# Patient Record
Sex: Male | Born: 1975 | Hispanic: No | Marital: Married | Smoking: Never smoker
Health system: Southern US, Community
[De-identification: ages and names within clinical notes are randomized; demographics above are authoritative.]

## PROBLEM LIST (undated history)

## (undated) DIAGNOSIS — Z9289 Personal history of other medical treatment: Secondary | ICD-10-CM

## (undated) DIAGNOSIS — F419 Anxiety disorder, unspecified: Secondary | ICD-10-CM

## (undated) DIAGNOSIS — M1990 Unspecified osteoarthritis, unspecified site: Secondary | ICD-10-CM

## (undated) DIAGNOSIS — I10 Essential (primary) hypertension: Secondary | ICD-10-CM

## (undated) DIAGNOSIS — M869 Osteomyelitis, unspecified: Secondary | ICD-10-CM

## (undated) DIAGNOSIS — M14671 Charcot's joint, right ankle and foot: Secondary | ICD-10-CM

## (undated) DIAGNOSIS — L97509 Non-pressure chronic ulcer of other part of unspecified foot with unspecified severity: Secondary | ICD-10-CM

## (undated) DIAGNOSIS — M109 Gout, unspecified: Secondary | ICD-10-CM

## (undated) DIAGNOSIS — S069X9A Unspecified intracranial injury with loss of consciousness of unspecified duration, initial encounter: Secondary | ICD-10-CM

## (undated) HISTORY — PX: WISDOM TOOTH EXTRACTION: SHX21

## (undated) HISTORY — PX: CHOLECYSTECTOMY: SHX55

## (undated) HISTORY — PX: FOOT FRACTURE SURGERY: SHX645

---
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
## 2016-10-01 ENCOUNTER — Emergency Department (HOSPITAL_COMMUNITY): Payer: Medicare Other

## 2016-10-01 ENCOUNTER — Encounter (HOSPITAL_COMMUNITY): Payer: Self-pay | Admitting: Emergency Medicine

## 2016-10-01 ENCOUNTER — Inpatient Hospital Stay (HOSPITAL_COMMUNITY)
Admission: EM | Admit: 2016-10-01 | Discharge: 2016-10-05 | DRG: 683 | Disposition: A | Discharge status: Home / Self Care | Payer: Medicare Other | Attending: Internal Medicine | Admitting: Internal Medicine

## 2016-10-01 DIAGNOSIS — M86671 Other chronic osteomyelitis, right ankle and foot: Secondary | ICD-10-CM | POA: Diagnosis present

## 2016-10-01 DIAGNOSIS — L97519 Non-pressure chronic ulcer of other part of right foot with unspecified severity: Secondary | ICD-10-CM | POA: Diagnosis present

## 2016-10-01 DIAGNOSIS — M14671 Charcot's joint, right ankle and foot: Secondary | ICD-10-CM | POA: Diagnosis present

## 2016-10-01 DIAGNOSIS — Z79899 Other long term (current) drug therapy: Secondary | ICD-10-CM

## 2016-10-01 DIAGNOSIS — N179 Acute kidney failure, unspecified: Secondary | ICD-10-CM | POA: Diagnosis present

## 2016-10-01 DIAGNOSIS — Z87820 Personal history of traumatic brain injury: Secondary | ICD-10-CM

## 2016-10-01 DIAGNOSIS — Z22322 Carrier or suspected carrier of Methicillin resistant Staphylococcus aureus: Secondary | ICD-10-CM

## 2016-10-01 DIAGNOSIS — E860 Dehydration: Secondary | ICD-10-CM | POA: Diagnosis present

## 2016-10-01 DIAGNOSIS — N182 Chronic kidney disease, stage 2 (mild): Secondary | ICD-10-CM | POA: Diagnosis present

## 2016-10-01 DIAGNOSIS — R55 Syncope and collapse: Secondary | ICD-10-CM | POA: Diagnosis not present

## 2016-10-01 DIAGNOSIS — M79609 Pain in unspecified limb: Secondary | ICD-10-CM | POA: Diagnosis not present

## 2016-10-01 DIAGNOSIS — G629 Polyneuropathy, unspecified: Secondary | ICD-10-CM | POA: Diagnosis present

## 2016-10-01 DIAGNOSIS — B965 Pseudomonas (aeruginosa) (mallei) (pseudomallei) as the cause of diseases classified elsewhere: Secondary | ICD-10-CM | POA: Diagnosis present

## 2016-10-01 DIAGNOSIS — I129 Hypertensive chronic kidney disease with stage 1 through stage 4 chronic kidney disease, or unspecified chronic kidney disease: Secondary | ICD-10-CM | POA: Diagnosis present

## 2016-10-01 DIAGNOSIS — M869 Osteomyelitis, unspecified: Secondary | ICD-10-CM

## 2016-10-01 DIAGNOSIS — E876 Hypokalemia: Secondary | ICD-10-CM | POA: Diagnosis present

## 2016-10-01 DIAGNOSIS — R7989 Other specified abnormal findings of blood chemistry: Secondary | ICD-10-CM

## 2016-10-01 DIAGNOSIS — I340 Nonrheumatic mitral (valve) insufficiency: Secondary | ICD-10-CM | POA: Diagnosis not present

## 2016-10-01 DIAGNOSIS — S069X9S Unspecified intracranial injury with loss of consciousness of unspecified duration, sequela: Secondary | ICD-10-CM | POA: Diagnosis not present

## 2016-10-01 DIAGNOSIS — Z91040 Latex allergy status: Secondary | ICD-10-CM | POA: Diagnosis not present

## 2016-10-01 DIAGNOSIS — M86171 Other acute osteomyelitis, right ankle and foot: Secondary | ICD-10-CM | POA: Diagnosis not present

## 2016-10-01 HISTORY — DX: Gout, unspecified: M10.9

## 2016-10-01 HISTORY — DX: Non-pressure chronic ulcer of other part of unspecified foot with unspecified severity: L97.509

## 2016-10-01 HISTORY — DX: Charcot's joint, right ankle and foot: M14.671

## 2016-10-01 HISTORY — DX: Anxiety disorder, unspecified: F41.9

## 2016-10-01 HISTORY — DX: Osteomyelitis, unspecified: M86.9

## 2016-10-01 HISTORY — DX: Unspecified osteoarthritis, unspecified site: M19.90

## 2016-10-01 HISTORY — DX: Essential (primary) hypertension: I10

## 2016-10-01 HISTORY — DX: Personal history of other medical treatment: Z92.89

## 2016-10-01 HISTORY — DX: Unspecified intracranial injury with loss of consciousness of unspecified duration, initial encounter: S06.9X9A

## 2016-10-01 LAB — CBC
HCT: 39 % (ref 39.0–52.0)
Hemoglobin: 12.5 g/dL — ABNORMAL LOW (ref 13.0–17.0)
MCH: 26.7 pg (ref 26.0–34.0)
MCHC: 32.1 g/dL (ref 30.0–36.0)
MCV: 83.2 fL (ref 78.0–100.0)
PLATELETS: 356 10*3/uL (ref 150–400)
RBC: 4.69 MIL/uL (ref 4.22–5.81)
RDW: 13.5 % (ref 11.5–15.5)
WBC: 11.5 10*3/uL — AB (ref 4.0–10.5)

## 2016-10-01 LAB — URINALYSIS, ROUTINE W REFLEX MICROSCOPIC
Bilirubin Urine: NEGATIVE
GLUCOSE, UA: NEGATIVE mg/dL
Hgb urine dipstick: NEGATIVE
KETONES UR: NEGATIVE mg/dL
LEUKOCYTES UA: NEGATIVE
NITRITE: NEGATIVE
PROTEIN: NEGATIVE mg/dL
Specific Gravity, Urine: 1.027 (ref 1.005–1.030)
pH: 5 (ref 5.0–8.0)

## 2016-10-01 LAB — BASIC METABOLIC PANEL
Anion gap: 11 (ref 5–15)
BUN: 12 mg/dL (ref 6–20)
CHLORIDE: 104 mmol/L (ref 101–111)
CO2: 24 mmol/L (ref 22–32)
CREATININE: 1.25 mg/dL — AB (ref 0.61–1.24)
Calcium: 9.7 mg/dL (ref 8.9–10.3)
Glucose, Bld: 97 mg/dL (ref 65–99)
POTASSIUM: 3.6 mmol/L (ref 3.5–5.1)
SODIUM: 139 mmol/L (ref 135–145)

## 2016-10-01 LAB — TROPONIN I: Troponin I: <0.03 ng/mL (ref <0.03)

## 2016-10-01 LAB — I-STAT CG4 LACTIC ACID, ED: LACTIC ACID, VENOUS: 1.03 mmol/L (ref 0.5–1.9)

## 2016-10-01 MED ORDER — VANCOMYCIN HCL IN DEXTROSE 1-5 GM/200ML-% IV SOLN
1000.0000 mg | Freq: Three times a day (TID) | INTRAVENOUS | Status: DC
  Administered 2016-10-01 – 2016-10-03 (×6): 1000 mg via INTRAVENOUS
  Filled 2016-10-01 (×8): qty 200

## 2016-10-01 MED ORDER — ACETAMINOPHEN 325 MG PO TABS: 650.0000 mg | ORAL_TABLET | Freq: Four times a day (QID) | ORAL | Status: DC | PRN

## 2016-10-01 MED ORDER — ONDANSETRON HCL 4 MG/2ML IJ SOLN
4.0000 mg | Freq: Four times a day (QID) | INTRAMUSCULAR | Status: DC | PRN
Start: 2016-10-01 — End: 2016-10-05

## 2016-10-01 MED ORDER — ONDANSETRON HCL 4 MG PO TABS: 4.0000 mg | ORAL_TABLET | Freq: Four times a day (QID) | ORAL | Status: DC | PRN

## 2016-10-01 MED ORDER — ACETAMINOPHEN 650 MG RE SUPP: 650.0000 mg | Freq: Four times a day (QID) | RECTAL | Status: DC | PRN

## 2016-10-01 MED ORDER — BISACODYL 10 MG RE SUPP: 10.0000 mg | Freq: Every day | RECTAL | Status: DC | PRN

## 2016-10-01 MED ORDER — SENNOSIDES-DOCUSATE SODIUM 8.6-50 MG PO TABS
1.0000 | ORAL_TABLET | Freq: Every evening | ORAL | Status: DC | PRN
Start: 2016-10-01 — End: 2016-10-03
  Filled 2016-10-01: qty 1

## 2016-10-01 MED ORDER — HEPARIN SODIUM (PORCINE) 5000 UNIT/ML IJ SOLN
5000.0000 [IU] | Freq: Three times a day (TID) | INTRAMUSCULAR | Status: DC
  Administered 2016-10-01 – 2016-10-04 (×9): 5000 [IU] via SUBCUTANEOUS
  Filled 2016-10-01 (×11): qty 1

## 2016-10-01 MED ORDER — PIPERACILLIN-TAZOBACTAM 3.375 G IVPB 30 MIN
3.3750 g | Freq: Once | INTRAVENOUS | Status: AC
  Administered 2016-10-01: 3.375 g via INTRAVENOUS
  Filled 2016-10-01: qty 50

## 2016-10-01 MED ORDER — HYDROCODONE-ACETAMINOPHEN 5-325 MG PO TABS
1.0000 | ORAL_TABLET | ORAL | Status: DC | PRN
  Administered 2016-10-01 – 2016-10-04 (×4): 2 via ORAL
  Filled 2016-10-01 (×4): qty 2

## 2016-10-01 MED ORDER — PIPERACILLIN-TAZOBACTAM 3.375 G IVPB
3.3750 g | Freq: Three times a day (TID) | INTRAVENOUS | Status: DC
  Administered 2016-10-01 – 2016-10-05 (×12): 3.375 g via INTRAVENOUS
  Filled 2016-10-01 (×13): qty 50

## 2016-10-01 MED ORDER — SODIUM CHLORIDE 0.9 % IV BOLUS (SEPSIS)
1000.0000 mL | Freq: Once | INTRAVENOUS | Status: AC
  Administered 2016-10-01: 1000 mL via INTRAVENOUS

## 2016-10-01 MED ORDER — VANCOMYCIN HCL 10 G IV SOLR
1500.0000 mg | Freq: Once | INTRAVENOUS | Status: AC
  Administered 2016-10-01: 1500 mg via INTRAVENOUS
  Filled 2016-10-01: qty 1500

## 2016-10-01 MED ORDER — SODIUM CHLORIDE 0.9 % IV SOLN
INTRAVENOUS | Status: DC
  Administered 2016-10-01 – 2016-10-02 (×4): via INTRAVENOUS
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
## 2016-10-01 NOTE — Consult Note (Signed)
WOC Nurse has reviewed record and this patient has a positive xray or MRI for osteomyelitis, this is considered outside of the scope of practice for the WOC nurse, for that reason WOC Nurse will not consult.  ? ?Re-consult if only topical wound care needed after orthopedic or surgical evaluation ?Peyson Postema MSN, RN, CWOCN, CNS, CWON-AP ?[phone]   ?

## 2016-10-01 NOTE — Progress Notes (Addendum)
Pt arrived nto unit. A&Ox4. No c/o of pain. Pt instructed to call for help when needed and verbalizes understanding.       Skin assessment done by Kandace Blitz, RN and Sabino Donovan, RN.

## 2016-10-01 NOTE — ED Notes (Signed)
Patient transported to X-ray 

## 2016-10-01 NOTE — ED Triage Notes (Signed)
Pt to ED via [facility] following witnessed syncopal episode. Pt's wife helped patient to floor, but patient does have hx TBI as a child and per wife, was misdiagnosed and given psychotropic drugs for years. Pt's wife states patient is supposed to have another scan of his head, but it was never scheduled. Patient neuro status is at baseline, A&O x 4. Pt denies pain.

## 2016-10-01 NOTE — ED Notes (Signed)
Ordered regular tray 

## 2016-10-01 NOTE — Progress Notes (Signed)
Pharmacy Antibiotic Note  Lance Wiley is a 41 y.o. male admitted on 10/01/2016 with osteomyelitis.  Pharmacy has been consulted for vancomycin and zosyn dosing. Pt is afebrile and WBC is mildly elevated at 11.5. SCr is mildly elevated at 1.25.   Plan: Vancomycin 1500mg  IV x 1 then 1gm IV Q8H Zosyn 3.375gm IV Q8H (4 hr inf0 F/u renal fxn, C&S, clinical status and trough at SS  Weight: 197 lb (89.4 kg)  Temp (24hrs), Avg:99.2 F (37.3 C), Min:99.2 F (37.3 C), Max:99.2 F (37.3 C)   Recent Labs Lab 10/01/16 0305  WBC 11.5*  CREATININE 1.25*    CrCl cannot be calculated (Unknown ideal weight.).    Allergies  Allergen Reactions  . Latex Rash    Antimicrobials this admission: Vanc 8/24>> Zosyn 8/24>>  Dose adjustments this admission: N/A  Microbiology results: Pending  Thank you for allowing pharmacy to be a part of this patient's care.  [name], Drake Leach 10/01/2016 1:20 PM
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
## 2016-10-01 NOTE — Progress Notes (Signed)
*  PRELIMINARY RESULTS* Vascular Ultrasound Right lower extremity venous duplex has been completed.  Preliminary findings: No evidence of deep vein thrombosis or Baker's cyst on the right.  Large lymph node noted in the right popliteal fossa, 3.2 x 2.2x 1.8cm.  Attempted to call preliminary report to ED nurse @ 11:55, no answer.  Chauncey Fischer 10/01/2016, 12:04 PM

## 2016-10-01 NOTE — Consult Note (Signed)
Reason for Consult:Foot wound Referring Physician: T Lance Wiley is an 41 y.o. male.  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 He has Charcot foot and has hx/o osteomyelitis. He has undergone multiple surgeries on the foot and is under the care of a podiatrist in [place]. He and his wife recently moved here but are in the process of moving back in the next couple of weeks. He was in his usual state of health when he entered an IP psych hospital for mediation adjustment. During that week his chronic plantar ulcer worsened with increased pain and drainage. The wife had been treating that but he had a syncopal episode and came to the ED.  Past Medical History:  Diagnosis Date  . Charcot's joint, right ankle and foot   . Hypertension   . TBI (traumatic brain injury) ([place])     No past surgical history on file.  No family history on file.  Social History:  reports that he has never smoked. He has never used smokeless tobacco. He reports that he does not drink alcohol or use drugs.  Allergies:  Allergies  Allergen Reactions  . Latex Rash    Medications: I have reviewed the patient's current medications.  Results for orders placed or performed during the hospital encounter of 10/01/16 (from the past 48 hour(s))  Urinalysis, Routine w reflex microscopic     Status: None   Collection Time: 10/01/16  2:53 AM  Result Value Ref Range   Color, Urine YELLOW YELLOW   APPearance CLEAR CLEAR   Specific Gravity, Urine 1.027 1.005 - 1.030   pH 5.0 5.0 - 8.0   Glucose, UA NEGATIVE NEGATIVE mg/dL   Hgb urine dipstick NEGATIVE NEGATIVE   Bilirubin Urine NEGATIVE NEGATIVE   Ketones, ur NEGATIVE NEGATIVE mg/dL   Protein, ur NEGATIVE NEGATIVE mg/dL   Nitrite NEGATIVE NEGATIVE   Leukocytes, UA NEGATIVE NEGATIVE  Basic metabolic panel     Status: Abnormal   Collection Time: 10/01/16  3:05 AM  Result Value Ref Range   Sodium 139 135 - 145 mmol/L   Potassium 3.6 3.5 - 5.1 mmol/L   Chloride 104 101 - 111 mmol/L   CO2 24 22 - 32 mmol/L   Glucose, Bld 97 65 - 99 mg/dL   BUN 12 6 - 20 mg/dL   Creatinine, Ser 1.25 (H) 0.61 - 1.24 mg/dL   Calcium 9.7 8.9 - 10.3 mg/dL   GFR calc non Af Amer >60 >60 mL/min   GFR calc Af Amer >60 >60 mL/min    Comment: (NOTE) The eGFR has been calculated using the CKD EPI equation. This calculation has not been validated in all clinical situations. eGFR's persistently <60 mL/min signify possible Chronic Kidney Disease.    Anion gap 11 5 - 15  CBC     Status: Abnormal   Collection Time: 10/01/16  3:05 AM  Result Value Ref Range   WBC 11.5 (H) 4.0 - 10.5 K/uL   RBC 4.69 4.22 - 5.81 MIL/uL   Hemoglobin 12.5 (L) 13.0 - 17.0 g/dL   HCT 39.0 39.0 - 52.0 %   MCV 83.2 78.0 - 100.0 fL   MCH 26.7 26.0 - 34.0 pg   MCHC 32.1 30.0 - 36.0 g/dL   RDW 13.5 11.5 - 15.5 %   Platelets 356 150 - 400 K/uL    Dg Ankle Complete Right  Result  Date: 10/01/2016 CLINICAL DATA:  Right foot and ankle swelling. EXAM: RIGHT ANKLE - COMPLETE 3+ VIEW COMPARISON:  None. FINDINGS: There is severe arthritis of the ankle joint with prominent osteophyte formation, loose bodies in the joint, and chronic extensive calcification in the distal Achilles tendon. There is also calcification in the plantar fascia. There is arthritis in the midfoot and in the subtalar joint. Prominent joint effusion at the ankle. IMPRESSION: Severe arthritis of the ankle joint with a prominent joint effusion. Loose bodies in the joint. Electronically Signed   By: Lorriane Shire M.D.   On: 10/01/2016 11:34   Ct Head Wo Contrast  Result Date: 10/01/2016 CLINICAL DATA:  Passed out at 6:30 p.m. last night. Tingling in the right leg and shoulder. History of traumatic brain injury in 1983. EXAM: CT HEAD WITHOUT CONTRAST TECHNIQUE: Contiguous axial images were obtained from the base of the skull through the vertex without intravenous  contrast. COMPARISON:  None. FINDINGS: Brain: No evidence of acute infarction, hemorrhage, hydrocephalus, extra-axial collection or mass lesion/mass effect. Encephalomalacia of the right anterior temporal lobe and right anterior and inferior frontal lobe. There is asymmetric gliosis in the right more than left frontal white matter with ventriculomegaly. Vascular: No hyperdense vessel or unexpected calcification. Skull: Multiple large calcified scalp masses consistent with dermal inclusion cyst. Old burr hole, likely for shunt, and left parietal bone. There is asymmetric calvarial thickening sclerosis on the right which may be sequela of previous asymmetry. Sinuses/Orbits: No acute finding. IMPRESSION: 1. No acute finding. 2. Right frontotemporal encephalomalacia and bitemporal atrophy correlating with history of remote traumatic brain injury. 3. Numerous dermal inclusion cysts in the scalp. Electronically Signed   By: Monte Fantasia M.D.   On: 10/01/2016 03:13   Dg Foot Complete Right  Result Date: 10/01/2016 CLINICAL DATA:  Right foot and ankle swelling for 7 days. EXAM: RIGHT FOOT COMPLETE - 3+ VIEW COMPARISON:  None. FINDINGS: There is marked destruction and deformity of the forefoot including the MTP joints and IP joints diffusely. Bones are well corticated suggesting a chronic process. Advanced degenerative changes in the hindfoot. Large calcification noted in the region of the distal Achilles, likely related to old Achilles/soft tissue injury. No definite acute fracture. Soft tissues are intact. IMPRESSION: Marked destruction deformity of the MTP and IP joints throughout the right foot most compatible with chronic osteomyelitis. Note definite acute process. If there is clinical concern for acute osteomyelitis, MRI may be beneficial for further evaluation. Electronically Signed   By: Rolm Baptise M.D.   On: 10/01/2016 11:33    Review of Systems  Constitutional: Negative for weight loss.  HENT:  Negative for ear discharge, ear pain, hearing loss and tinnitus.   Eyes: Negative for blurred vision, double vision, photophobia and pain.  Respiratory: Negative for cough, sputum production and shortness of breath.   Cardiovascular: Negative for chest pain.  Gastrointestinal: Negative for abdominal pain, nausea and vomiting.  Genitourinary: Negative for dysuria, flank pain, frequency and urgency.  Musculoskeletal: Positive for joint pain (Right foot). Negative for back pain, falls, myalgias and neck pain.  Neurological: Positive for sensory change and loss of consciousness. Negative for dizziness, tingling, focal weakness and headaches.  Endo/Heme/Allergies: Does not bruise/bleed easily.  Psychiatric/Behavioral: Negative for depression, memory loss and substance abuse. The patient is not nervous/anxious.    Blood pressure 133/86, pulse 89, temperature 99.2 F (37.3 C), temperature source Oral, resp. rate 14, weight 89.4 kg (197 lb), SpO2 98 %. Physical Exam  Constitutional: He  appears well-developed and well-nourished. No distress.  HENT:  Head: Normocephalic.  Eyes: Conjunctivae are normal. Right eye exhibits no discharge. Left eye exhibits no discharge. No scleral icterus.  Cardiovascular: Normal rate and regular rhythm.   Respiratory: Effort normal. No respiratory distress.  Musculoskeletal:  RLE No traumatic wounds, ecchymosis, or rash  TTP medial foot/ankle  No knee effusion, likely some ankle effusion or generalized swelling based on wife's description  Knee stable to varus/ valgus and anterior/posterior stress  Sens DPN, SPN, TN nearly absent  Motor EHL, ext, flex, evers 5/5, limited motion  DP 2+, PT 2+   Neurological: He is alert.  Skin: Skin is warm and dry. He is not diaphoretic.  Psychiatric: He has a normal mood and affect. His behavior is normal.      Assessment/Plan: Syncope -- Admit by IM for workup. Unsure if related to systemic involvement from acute on chronic  infection of foot or psych medication adjustments or other. Right Charcot foot -- WOC consult for wound, MR for delineation of acute vs chronic involvement. Dr. Sharol Given to assess on Monday. Please continue broad-spectrum abx. Contact Dr. Veverly Fells over weekend with any acute questions.  I have seen and examined this patient and agree with the above assessment and plan.  Wound maceration is present.  The wound bed shows granulating tissue. Diffuse swelling below the knee and some warmth and erythema is present to suggest cellulitis. No evidence of active purulent drainage or abscess clinically.  XRAYs very difficult to interpret due to the chronic foot issues dating back decades. There is no indication for urgent or emergent surgery at this time.  Patient covered with broad spectrum empiric antibiotics.  Continue daily dry dressing changed to wick moisture away from wound.  Continue elevation.  Patient interested in Dr Jess Barters opinion regarding next steps in evaluation and treatment.    Lisette Abu, PA-C Orthopedic Surgery [phone] 10/01/2016, 2:48 PM

## 2016-10-01 NOTE — ED Notes (Signed)
Care handoff to Casey RN 

## 2016-10-02 ENCOUNTER — Inpatient Hospital Stay (HOSPITAL_COMMUNITY): Payer: Medicare Other

## 2016-10-02 DIAGNOSIS — I340 Nonrheumatic mitral (valve) insufficiency: Secondary | ICD-10-CM

## 2016-10-02 DIAGNOSIS — M86171 Other acute osteomyelitis, right ankle and foot: Secondary | ICD-10-CM

## 2016-10-02 DIAGNOSIS — M869 Osteomyelitis, unspecified: Secondary | ICD-10-CM

## 2016-10-02 DIAGNOSIS — R55 Syncope and collapse: Secondary | ICD-10-CM

## 2016-10-02 DIAGNOSIS — N179 Acute kidney failure, unspecified: Principal | ICD-10-CM

## 2016-10-02 LAB — CBC
HEMATOCRIT: 35 % — AB (ref 39.0–52.0)
HEMOGLOBIN: 11.2 g/dL — AB (ref 13.0–17.0)
MCH: 26.7 pg (ref 26.0–34.0)
MCHC: 32 g/dL (ref 30.0–36.0)
MCV: 83.3 fL (ref 78.0–100.0)
Platelets: 237 10*3/uL (ref 150–400)
RBC: 4.2 MIL/uL — AB (ref 4.22–5.81)
RDW: 13.5 % (ref 11.5–15.5)
WBC: 4.5 10*3/uL (ref 4.0–10.5)

## 2016-10-02 LAB — ECHOCARDIOGRAM COMPLETE
Height: 75.5 in
WEIGHTICAEL: 3158.4 [oz_av]

## 2016-10-02 LAB — COMPREHENSIVE METABOLIC PANEL
ALT: 13 U/L — AB (ref 17–63)
ANION GAP: 5 (ref 5–15)
AST: 15 U/L (ref 15–41)
Albumin: 2.8 g/dL — ABNORMAL LOW (ref 3.5–5.0)
Alkaline Phosphatase: 87 U/L (ref 38–126)
BUN: 12 mg/dL (ref 6–20)
CHLORIDE: 109 mmol/L (ref 101–111)
CO2: 25 mmol/L (ref 22–32)
CREATININE: 1.22 mg/dL (ref 0.61–1.24)
Calcium: 8.2 mg/dL — ABNORMAL LOW (ref 8.9–10.3)
GFR calc non Af Amer: >60 mL/min (ref >60)
Glucose, Bld: 96 mg/dL (ref 65–99)
POTASSIUM: 4.2 mmol/L (ref 3.5–5.1)
SODIUM: 139 mmol/L (ref 135–145)
Total Bilirubin: 0.6 mg/dL (ref 0.3–1.2)
Total Protein: 5.8 g/dL — ABNORMAL LOW (ref 6.5–8.1)

## 2016-10-02 LAB — MRSA PCR SCREENING: MRSA by PCR: POSITIVE — AB

## 2016-10-02 LAB — HIV ANTIBODY (ROUTINE TESTING W REFLEX): HIV Screen 4th Generation wRfx: NONREACTIVE

## 2016-10-02 LAB — TROPONIN I

## 2016-10-02 LAB — PROTIME-INR
INR: 1.23
Prothrombin Time: 15.6 seconds — ABNORMAL HIGH (ref 11.4–15.2)

## 2016-10-02 MED ORDER — CHLORHEXIDINE GLUCONATE CLOTH 2 % EX PADS
6.0000 | MEDICATED_PAD | Freq: Every day | CUTANEOUS | Status: DC
  Administered 2016-10-03 – 2016-10-05 (×3): 6 via TOPICAL

## 2016-10-02 MED ORDER — GADOBENATE DIMEGLUMINE 529 MG/ML IV SOLN
20.0000 mL | Freq: Once | INTRAVENOUS | Status: AC | PRN
  Administered 2016-10-02: 20 mL via INTRAVENOUS

## 2016-10-02 MED ORDER — MUPIROCIN 2 % EX OINT
1.0000 "application " | TOPICAL_OINTMENT | Freq: Two times a day (BID) | CUTANEOUS | Status: DC
  Administered 2016-10-02 – 2016-10-05 (×6): 1 via NASAL
  Filled 2016-10-02: qty 22

## 2016-10-02 NOTE — Progress Notes (Signed)
Dr.Norris, orthopedic MD, came by to see patient late tonight but since patient was sleeping, MD decided to come back in the morning to see the patient instead.  MD asked for 4x4 gauzes, kerlix, abdominal pads, and an ACE bandage to be at the patient's bedside so he could dress the patient's wound tomorrow morning. RN will place all supplies at bedside for MD. Will continue to monitor and treat per MD orders.

## 2016-10-02 NOTE — Progress Notes (Signed)
Asked by Dr Roda Shutters to complete med rec on patient. Per our med hx tech, the patient has not taken any medications in over 30 days, so no medications we documented.  Isaac Bliss, PharmD, BCPS, BCCCP Clinical Pharmacist 10/02/2016 3:23 PM

## 2016-10-02 NOTE — Progress Notes (Signed)
VASCULAR LAB PRELIMINARY  ARTERIAL  ABI completed:ABIs and waveforms are within normal limits.    RIGHT    LEFT    PRESSURE WAVEFORM  PRESSURE WAVEFORM  BRACHIAL 141 T BRACHIAL 141 T  DP   DP    AT 156 T AT 146 T  PT 172 T PT 179 T  PER   PER    GREAT TOE  NA GREAT TOE  NA    RIGHT LEFT  ABI 1.22 1.27     Kaitrin Seybold, RVT 10/02/2016, 1:05 PM

## 2016-10-02 NOTE — Progress Notes (Signed)
  Echocardiogram 2D Echocardiogram has been performed.  Lance Wiley 10/02/2016, 4:02 PM 

## 2016-10-02 NOTE — Progress Notes (Signed)
PROGRESS NOTE  Colin Behrle PPI:[id number] DOB: 02-04-1976 DOA: 10/01/2016 PCP: Patient, No Pcp Per  HPI/Recap of past 24 hours:  Denies pain, no fever, wife at bedside  Assessment/Plan: Active Problems:   Osteomyelitis of right foot (HCC)   AKI (acute kidney injury) (HCC)   Osteomyelitis (HCC)   Osteomyelitis of the R foot, with h/o multiple procedures, debridement to the area.   Not septic appearing. WBC 11.5 . Afebrile. VSS   LActic wnl.  Neg for DVT, ABI unremarkable, MRI R foot ordered, not done yet, blood culture obtained on 8/25 after abx given, mrsa screening, wound superficial culture pending collection Ortho and wound care consulted, input appreciated    continue IVF and IV atibiotics with Vanc and Zosyn that was  initiated in the ED.    a large lymph node on the R popl. Fossa seen in [place] WIll consider L.n. Biopsy  (reactive vs. Obstructive ) if after antibiotics administered, the lymph node does not reduce in size   Syncope, in the setting of infection and dehydration,  CT head no acute findings EKG ST with possible RBBB. Denies chest pain and palpitations  negative troponin   2 D echo  pending Continue ivf,    Acute Kidney Injury likely due to dehydration vs infection   , ua unremarkable Continue IVF CMET in am , renal dosing meds  H/o TBI from [facility] when he was 41 years old:  He was on psych meds, he was recently evaluated at [facility] psych facility, was taken off psych meds, he has not been on any meds for the last 30days per wife.    DVT prophylaxis:  Heparin  Code Status:   Full   Family Communication:  patient, wife at bedside Disposition Plan: Expect patient to be discharged to home after condition improves, may need home health, wound care, pt eval pending, per patient they plan to move back to [place] in a few weeks Consults called:    Ortho   Procedures:  none  Antibiotics:  vanc/zosyn   Objective: BP 118/76 (BP Location: Right Arm)   Pulse  65   Temp 97.6 F (36.4 C) (Oral)   Resp 18   Ht 6' 3.5" (1.918 m)   Wt 89.5 kg (197 lb 6.4 oz)   SpO2 100%   BMI 24.35 kg/m   Intake/Output Summary (Last 24 hours) at 10/02/16 1005 Last data filed at 10/02/16 0615  Gross per 24 hour  Intake             2850 ml  Output              300 ml  Net             2550 ml   Filed Weights   10/01/16 1300 10/01/16 1739 10/02/16 0526  Weight: 89.4 kg (197 lb) 89.9 kg (198 lb 4.8 oz) 89.5 kg (197 lb 6.4 oz)    Exam: Patient is examined daily including today on 10/02/2016, exams remain the same as of yesterday except that has changed    General:  NAD  Cardiovascular: RRR  Respiratory: CTABL  Abdomen: Soft/ND/NT, positive BS  Musculoskeletal: right foot deformity, decreased sensation, dressing in place  Neuro: alert, oriented   Data Reviewed: Basic Metabolic Panel:  Recent Labs Lab 10/01/16 0305 10/02/16 0308  NA 139 139  K 3.6 4.2  CL 104 109  CO2 24 25  GLUCOSE 97 96  BUN 12 12  CREATININE 1.25* 1.22  CALCIUM 9.7  8.2*   Liver Function Tests:  Recent Labs Lab 10/02/16 0308  AST 15  ALT 13*  ALKPHOS 87  BILITOT 0.6  PROT 5.8*  ALBUMIN 2.8*   No results for input(s): LIPASE, AMYLASE in the last 168 hours. No results for input(s): AMMONIA in the last 168 hours. CBC:  Recent Labs Lab 10/01/16 0305 10/02/16 0308  WBC 11.5* 4.5  HGB 12.5* 11.2*  HCT 39.0 35.0*  MCV 83.2 83.3  PLT 356 237   Cardiac Enzymes:    Recent Labs Lab 10/01/16 1520 10/01/16 2056 10/02/16 0308  TROPONINI <0.03 <0.03 <0.03   BNP (last 3 results) No results for input(s): BNP in the last 8760 hours.  ProBNP (last 3 results) No results for input(s): PROBNP in the last 8760 hours.  CBG: No results for input(s): GLUCAP in the last 168 hours.  No results found for this or any previous visit (from the past 240 hour(s)).   Studies: Dg Ankle Complete Right  Result Date: 10/01/2016 CLINICAL DATA:  Right foot and ankle  swelling. EXAM: RIGHT ANKLE - COMPLETE 3+ VIEW COMPARISON:  None. FINDINGS: There is severe arthritis of the ankle joint with prominent osteophyte formation, loose bodies in the joint, and chronic extensive calcification in the distal Achilles tendon. There is also calcification in the plantar fascia. There is arthritis in the midfoot and in the subtalar joint. Prominent joint effusion at the ankle. IMPRESSION: Severe arthritis of the ankle joint with a prominent joint effusion. Loose bodies in the joint. Electronically Signed   By: Francene Boyers M.D.   On: 10/01/2016 11:34   Dg Foot Complete Right  Result Date: 10/01/2016 CLINICAL DATA:  Right foot and ankle swelling for 7 days. EXAM: RIGHT FOOT COMPLETE - 3+ VIEW COMPARISON:  None. FINDINGS: There is marked destruction and deformity of the forefoot including the MTP joints and IP joints diffusely. Bones are well corticated suggesting a chronic process. Advanced degenerative changes in the hindfoot. Large calcification noted in the region of the distal Achilles, likely related to old Achilles/soft tissue injury. No definite acute fracture. Soft tissues are intact. IMPRESSION: Marked destruction deformity of the MTP and IP joints throughout the right foot most compatible with chronic osteomyelitis. Note definite acute process. If there is clinical concern for acute osteomyelitis, MRI may be beneficial for further evaluation. Electronically Signed   By: Charlett Nose M.D.   On: 10/01/2016 11:33    Scheduled Meds: . heparin  5,000 Units Subcutaneous Q8H    Continuous Infusions: . sodium chloride 100 mL/hr at 10/02/16 0642  . piperacillin-tazobactam (ZOSYN)  IV 3.375 g (10/02/16 0513)  . vancomycin 1,000 mg (10/02/16 0515)     Time spent: I have personally reviewed and interpreted on  10/02/2016 daily labs, tele strips, imagings as discussed above under date review session and assessment and plans.  I have discussed plan of care as described  above with  patient and family on 10/02/2016   Aarit Kashuba MD, PhD  [facility] Pager [phone]. If 7PM-7AM, please contact night-coverage at [email or web address], password [email or web address] 10/02/2016, 10:05 AM  LOS: 1 day

## 2016-10-02 NOTE — Progress Notes (Signed)
Patient IV infiltrated.  When told he would need a new IV patient request that the "professional IV people" start his IV.  IV team consult ordered.  Will continue to monitor patient.

## 2016-10-03 DIAGNOSIS — E876 Hypokalemia: Secondary | ICD-10-CM

## 2016-10-03 DIAGNOSIS — S069X9S Unspecified intracranial injury with loss of consciousness of unspecified duration, sequela: Secondary | ICD-10-CM

## 2016-10-03 LAB — BASIC METABOLIC PANEL
Anion gap: 8 (ref 5–15)
BUN: 10 mg/dL (ref 6–20)
CALCIUM: 8.6 mg/dL — AB (ref 8.9–10.3)
CO2: 27 mmol/L (ref 22–32)
Chloride: 104 mmol/L (ref 101–111)
Creatinine, Ser: 1.17 mg/dL (ref 0.61–1.24)
GFR calc Af Amer: >60 mL/min (ref >60)
Glucose, Bld: 101 mg/dL — ABNORMAL HIGH (ref 65–99)
POTASSIUM: 3.6 mmol/L (ref 3.5–5.1)
SODIUM: 139 mmol/L (ref 135–145)

## 2016-10-03 LAB — CBC
HCT: 36.2 % — ABNORMAL LOW (ref 39.0–52.0)
Hemoglobin: 11.3 g/dL — ABNORMAL LOW (ref 13.0–17.0)
MCH: 26.2 pg (ref 26.0–34.0)
MCHC: 31.2 g/dL (ref 30.0–36.0)
MCV: 84 fL (ref 78.0–100.0)
PLATELETS: 256 10*3/uL (ref 150–400)
RBC: 4.31 MIL/uL (ref 4.22–5.81)
RDW: 13.5 % (ref 11.5–15.5)
WBC: 4.1 10*3/uL (ref 4.0–10.5)

## 2016-10-03 LAB — MAGNESIUM: MAGNESIUM: 2 mg/dL (ref 1.7–2.4)

## 2016-10-03 LAB — SEDIMENTATION RATE: Sed Rate: 10 mm/hr (ref 0–16)

## 2016-10-03 LAB — VANCOMYCIN, TROUGH: VANCOMYCIN TR: 23 ug/mL — AB (ref 15–20)

## 2016-10-03 LAB — C-REACTIVE PROTEIN: CRP: 1.6 mg/dL — ABNORMAL HIGH (ref <1.0)

## 2016-10-03 MED ORDER — POTASSIUM CHLORIDE CRYS ER 20 MEQ PO TBCR
40.0000 meq | EXTENDED_RELEASE_TABLET | Freq: Once | ORAL | Status: AC
  Administered 2016-10-03: 40 meq via ORAL
  Filled 2016-10-03: qty 2

## 2016-10-03 MED ORDER — SENNOSIDES-DOCUSATE SODIUM 8.6-50 MG PO TABS
1.0000 | ORAL_TABLET | Freq: Two times a day (BID) | ORAL | Status: DC
  Administered 2016-10-03 – 2016-10-04 (×3): 1 via ORAL
  Filled 2016-10-03 (×4): qty 1

## 2016-10-03 MED ORDER — VANCOMYCIN HCL IN DEXTROSE 750-5 MG/150ML-% IV SOLN
750.0000 mg | Freq: Three times a day (TID) | INTRAVENOUS | Status: DC
  Administered 2016-10-03 – 2016-10-04 (×3): 750 mg via INTRAVENOUS
  Filled 2016-10-03 (×4): qty 150

## 2016-10-03 MED ORDER — POLYETHYLENE GLYCOL 3350 17 G PO PACK
17.0000 g | PACK | Freq: Every day | ORAL | Status: DC
  Administered 2016-10-03 – 2016-10-04 (×2): 17 g via ORAL
  Filled 2016-10-03 (×3): qty 1
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
## 2016-10-03 NOTE — Progress Notes (Signed)
Pharmacy Antibiotic Note  Ivory Thorman is a 41 y.o. male admitted on 10/01/2016 with osteomyelitis.   vanc tr - 23  Plan: Decrease vanc to 750 q8  Height: 6' 3.5" (191.8 cm) Weight: 197 lb 12.8 oz (89.7 kg) IBW/kg (Calculated) : 85.65  Temp (24hrs), Avg:97.9 F (36.6 C), Min:97.6 F (36.4 C), Max:98.3 F (36.8 C)   Recent Labs Lab 10/01/16 0305 10/01/16 1510 10/02/16 0308 10/03/16 0558 10/03/16 1352  WBC 11.5*  --  4.5 4.1  --   CREATININE 1.25*  --  1.22 1.17  --   LATICACIDVEN  --  1.03  --   --   --   VANCOTROUGH  --   --   --   --  23*    Estimated Creatinine Clearance: 100.7 mL/min (by C-G formula based on SCr of 1.17 mg/dL).    Allergies  Allergen Reactions  . Latex Rash   Isaac Bliss, PharmD, BCPS, BCCCP Clinical Pharmacist Clinical phone for 10/03/2016 from 7a-3:30p: [phone] If after 3:30p, please call main pharmacy at: [phone] 10/03/2016 4:08 PM

## 2016-10-03 NOTE — Progress Notes (Signed)
Chaplain received a page for this patient that patient wanted to see a Chaplain.  Chaplain responded to the page.  Patient with wife at bedside states that they are ready to complete AD.  Chaplain shares with patient that it can be taken care of Monday - Friday and that we don't have support staff here on weekends.  Patient then said he understood and asked for prayer.  Before prayer, patient was making meaning of this event in the hospital and states he has had numerous surgeries and that he and his wife have been married for over a year now.   Chaplain will notify department about AD completion.      10/03/16 1701  Clinical Encounter Type  Visited With Patient and family together  Visit Type Initial;Psychological support;Spiritual support;Social support  Referral From Other (Comment) Tax adviser)  Consult/Referral To [employer]

## 2016-10-04 DIAGNOSIS — L97519 Non-pressure chronic ulcer of other part of right foot with unspecified severity: Secondary | ICD-10-CM

## 2016-10-04 LAB — CBC
HCT: 37.5 % — ABNORMAL LOW (ref 39.0–52.0)
Hemoglobin: 12.1 g/dL — ABNORMAL LOW (ref 13.0–17.0)
MCH: 27.2 pg (ref 26.0–34.0)
MCHC: 32.3 g/dL (ref 30.0–36.0)
MCV: 84.3 fL (ref 78.0–100.0)
PLATELETS: 291 10*3/uL (ref 150–400)
RBC: 4.45 MIL/uL (ref 4.22–5.81)
RDW: 13.8 % (ref 11.5–15.5)
WBC: 4.1 10*3/uL (ref 4.0–10.5)

## 2016-10-04 LAB — TSH: TSH: 2.669 u[IU]/mL (ref 0.350–4.500)

## 2016-10-04 LAB — BASIC METABOLIC PANEL
Anion gap: 7 (ref 5–15)
BUN: 10 mg/dL (ref 6–20)
CALCIUM: 9 mg/dL (ref 8.9–10.3)
CO2: 28 mmol/L (ref 22–32)
CREATININE: 1.12 mg/dL (ref 0.61–1.24)
Chloride: 103 mmol/L (ref 101–111)
GFR calc Af Amer: >60 mL/min (ref >60)
Glucose, Bld: 100 mg/dL — ABNORMAL HIGH (ref 65–99)
POTASSIUM: 4.5 mmol/L (ref 3.5–5.1)
SODIUM: 138 mmol/L (ref 135–145)

## 2016-10-04 LAB — CORTISOL: Cortisol, Plasma: 16 ug/dL

## 2016-10-04 MED ORDER — SILVER SULFADIAZINE 1 % EX CREA
1.0000 "application " | TOPICAL_CREAM | Freq: Every day | CUTANEOUS | Status: DC
  Administered 2016-10-05: 1 via TOPICAL
  Filled 2016-10-04: qty 85

## 2016-10-04 MED ORDER — SILVER SULFADIAZINE 1 % EX CREA: 1.0000 "application " | TOPICAL_CREAM | Freq: Every day | CUTANEOUS | 0 refills | Status: DC
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
## 2016-10-04 NOTE — Progress Notes (Signed)
PT Cancellation Note/Discharge  Patient Details Name: Lance Wiley MRN: [id number] DOB: 08/28/1975   Cancelled Treatment:    Reason Eval/Treat Not Completed: PT screened, no needs identified, will sign off.  I spoke with pt and his wife at length.  Per pt/wife he is completely mobile, if she helps him at all with mobility it is for getting into and out of a tub shower/bath.  He has an old TBI (since he was 41 y.o.) and has this chronic wound on his foot for which he cannot heal.  He and his wife would like an electric WC to help keep him off off his foot.  I advised him if he wanted and electric WC he would have to f/u with his PCP.  We could only offer a manual WC (which would then prevent him from getting an electric WC).  Please re-order if ortho decides to operate.  PT to sign off for now.  Thanks,    Rollene Rotunda. Scarleth Brame, PT, DPT [phone]   10/04/2016, 2:13 PM

## 2016-10-04 NOTE — Progress Notes (Signed)
Orthopedic Tech Progress Note Patient Details:  Lance Wiley 28-Apr-1975 [id number]  Ortho Devices Type of Ortho Device: Postop shoe/boot Ortho Device/Splint Location: RLE Ortho Device/Splint Interventions: Ordered, Application   Jennye Moccasin 10/04/2016, 5:07 PM

## 2016-10-04 NOTE — Progress Notes (Addendum)
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  Assessment/Plan: Active Problems:   Osteomyelitis of right foot (HCC)   AKI (acute kidney injury) (HCC)   Osteomyelitis (HCC)  Insensate neuropathy right lower extremity with a Wagner grade 1 ulcer. with h/o multiple procedures, debridement to the area. -initially concerned for ?Osteomyelitis of the R foot,    -Not septic appearing. WBC 11.5 . Afebrile. VSS   LActic wnl.  -Neg for DVT, ABI unremarkable, MRI R foot with myositis, reactive marrow edema and soft tissue ulcer along with multiple chronic changes,   -blood culture obtained on 8/25 after abx given, blood culture no growth -mrsa screening +,  -wound superficial culture + pseudomona -Ortho Dr Lajoyce Corners input appreciated, he did not think there is deep infection, he recommend oral abx at discharge to treat pseudomonas once culture result finalized ,  " Physical therapy progressive ambulation nonweightbearing on the right lower extremity. Silvadene dressing changes daily and oral antibiotics at time of discharge to treat the Pseudomonas."  Postop shoe/boot ordered by Dr Lajoyce Corners. -  Vanc and Zosyn that was  initiated in the ED. vanc discontinued on 8/27 once culture growth pseudomonas. Awaiting final culture result     a large lymph node on the R popl. Fossa seen in [place] WIll consider L.n. Biopsy  (reactive vs. Obstructive ) if after antibiotics administered, the lymph node does not reduce in size   Syncope, in the setting of infection and dehydration,  CT head no acute findings EKG ST with possible RBBB. Denies chest pain and palpitations  negative troponin   2 D echo  unremarkale orthostatic vital sign, no drop of bp, but dose has increase  heart rate from 60 to 76 upon standing, He received IVF, improving Tele unrevealing, d/c tele.  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  MRSA colonization: contact precaution, decolonization protocol  DVT prophylaxis:  Heparin  Code Status:   Full   Family Communication:  patient, wife at bedside Disposition Plan: Expect patient to be discharged to home on oral abx Consults called:    Ortho   Procedures:  none  Antibiotics:  vanc/zosyn   Objective: BP 122/90 (BP Location: Right Arm)   Pulse 60   Temp 97.6 F (36.4 C)   Resp 16   Ht 6' 3.5" (1.918 m)   Wt 90.7 kg (200 lb)   SpO2 100%   BMI 24.67 kg/m   Intake/Output Summary (Last 24 hours) at 10/04/16 1505 Last data filed at 10/04/16 0755  Gross per 24 hour  Intake             2815 ml  Output              725 ml  Net  2090 ml   Filed Weights   10/02/16 0526 10/03/16 0500 10/04/16 0500  Weight: 89.5 kg (197 lb 6.4 oz) 89.7 kg (197 lb 12.8 oz) 90.7 kg (200 lb)    Exam: Patient is examined daily including today on 10/04/2016, exams remain the same as of yesterday except that has changed    General:  NAD  Cardiovascular: RRR  Respiratory: CTABL  Abdomen: Soft/ND/NT, positive BS  Musculoskeletal: right foot deformity, decreased sensation, dressing in place, good pulse  Neuro: alert, oriented   Data Reviewed: Basic Metabolic Panel:  Recent Labs Lab 10/01/16 0305 10/02/16 0308 10/03/16 0558 10/04/16 0913  NA 139 139 139 138  K 3.6 4.2 3.6 4.5  CL 104 109 104 103  CO2 24 25 27 28   GLUCOSE 97 96 101* 100*  BUN 12 12 10 10     CREATININE 1.25* 1.22 1.17 1.12  CALCIUM 9.7 8.2* 8.6* 9.0  MG  --   --  2.0  --    Liver Function Tests:  Recent Labs Lab 10/02/16 0308  AST 15  ALT 13*  ALKPHOS 87  BILITOT 0.6  PROT 5.8*  ALBUMIN 2.8*   No results for input(s): LIPASE, AMYLASE in the last 168 hours. No results for input(s): AMMONIA in the last 168 hours. CBC:  Recent Labs Lab 10/01/16 0305 10/02/16 0308 10/03/16 0558 10/04/16 0913  WBC 11.5* 4.5 4.1 4.1  HGB 12.5* 11.2* 11.3* 12.1*  HCT 39.0 35.0* 36.2* 37.5*  MCV 83.2 83.3 84.0 84.3  PLT 356 237 256 291   Cardiac Enzymes:    Recent Labs Lab 10/01/16 1520 10/01/16 2056 10/02/16 0308  TROPONINI <0.03 <0.03 <0.03   BNP (last 3 results) No results for input(s): BNP in the last 8760 hours.  ProBNP (last 3 results) No results for input(s): PROBNP in the last 8760 hours.  CBG: No results for input(s): GLUCAP in the last 168 hours.  Recent Results (from the past 240 hour(s))  Culture, blood (routine x 2)     Status: None (Preliminary result)   Collection Time: 10/02/16 11:40 AM  Result Value Ref Range Status   Specimen Description BLOOD RIGHT HAND  Final   Special Requests   Final    BOTTLES DRAWN AEROBIC AND ANAEROBIC Blood Culture adequate volume   Culture NO GROWTH 2 DAYS  Final   Report Status PENDING  Incomplete  Culture, blood (routine x 2)     Status: None (Preliminary result)   Collection Time: 10/02/16 11:40 AM  Result Value Ref Range Status   Specimen Description BLOOD LEFT HAND  Final   Special Requests   Final    BOTTLES DRAWN AEROBIC AND ANAEROBIC Blood Culture adequate volume   Culture NO GROWTH 2 DAYS  Final   Report Status PENDING  Incomplete  MRSA PCR Screening     Status: Abnormal   Collection Time: 10/02/16  4:14 PM  Result Value Ref Range Status   MRSA by PCR POSITIVE (A) NEGATIVE Final    Comment:        The GeneXpert MRSA Assay (FDA approved for NASAL specimens only), is one component of a comprehensive  MRSA colonization surveillance program. It is not intended to diagnose MRSA infection nor to guide or monitor treatment for MRSA infections. CRITICAL RESULT CALLED TO, READ BACK BY AND VERIFIED WITH: RN Doylene Canning [id number]   Aerobic Culture (superficial specimen)     Status: None (Preliminary result)   Collection Time: 10/02/16  4:14 PM  Result  Value Ref Range Status   Specimen Description WOUND RIGHT FOOT  Final   Special Requests NONE  Final   Gram Stain   Final    RARE WBC PRESENT,BOTH PMN AND MONONUCLEAR FEW GRAM POSITIVE RODS RARE GRAM POSITIVE COCCI    Culture   Final    ABUNDANT PSEUDOMONAS AERUGINOSA SUSCEPTIBILITIES TO FOLLOW    Report Status PENDING  Incomplete     Studies: No results found.  Scheduled Meds: . Chlorhexidine Gluconate Cloth  6 each Topical Q0600  . heparin  5,000 Units Subcutaneous Q8H  . mupirocin ointment  1 application Nasal BID  . polyethylene glycol  17 g Oral Daily  . senna-docusate  1 tablet Oral BID    Continuous Infusions: . sodium chloride 100 mL/hr at 10/02/16 2144  . piperacillin-tazobactam (ZOSYN)  IV Stopped (10/04/16 0948)  . vancomycin Stopped (10/04/16 4235)     Time spent: I have personally reviewed and interpreted on  10/04/2016 daily labs, tele strips, imagings as discussed above under date review session and assessment and plans.  Case discussed with orthopedic Dr Lajoyce Corners  I have discussed plan of care as described above with  patient and family on 10/04/2016   Maui Britten MD, PhD  [facility] Pager [phone]. If 7PM-7AM, please contact night-coverage at [email or web address], password [facility] 10/04/2016, 3:05 PM  LOS: 3 days

## 2016-10-05 LAB — AEROBIC CULTURE W GRAM STAIN (SUPERFICIAL SPECIMEN)

## 2016-10-05 LAB — AEROBIC CULTURE  (SUPERFICIAL SPECIMEN)

## 2016-10-05 LAB — GLUCOSE, CAPILLARY: GLUCOSE-CAPILLARY: 92 mg/dL (ref 65–99)

## 2016-10-05 MED ORDER — SILVER SULFADIAZINE 1 % EX CREA: 1.0000 "application " | TOPICAL_CREAM | Freq: Every day | CUTANEOUS | 0 refills | Status: AC

## 2016-10-05 MED ORDER — SACCHAROMYCES BOULARDII 250 MG PO CAPS: 250.0000 mg | ORAL_CAPSULE | Freq: Two times a day (BID) | ORAL | 0 refills | Status: DC

## 2016-10-05 MED ORDER — SACCHAROMYCES BOULARDII 250 MG PO CAPS: 250.0000 mg | ORAL_CAPSULE | Freq: Two times a day (BID) | ORAL | 0 refills | Status: AC

## 2016-10-05 MED ORDER — CIPROFLOXACIN HCL 500 MG PO TABS: 500.0000 mg | ORAL_TABLET | Freq: Two times a day (BID) | ORAL | 0 refills | Status: DC

## 2016-10-05 MED ORDER — HYDROCODONE-ACETAMINOPHEN 5-325 MG PO TABS: 1.0000 | ORAL_TABLET | Freq: Every evening | ORAL | 0 refills | Status: AC | PRN

## 2016-10-05 MED ORDER — CIPROFLOXACIN HCL 500 MG PO TABS: 500.0000 mg | ORAL_TABLET | Freq: Two times a day (BID) | ORAL | 0 refills | Status: AC

## 2016-10-05 NOTE — Progress Notes (Signed)
Patient was discharged home by MD order; discharged instructions  review and give to patient and his wife with care notes and prescriptions; IV DIC; taxi voucher offered by Child psychotherapist; patient will be escorted to the car by a volunteer via wheelchair.
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
## 2016-10-05 NOTE — Progress Notes (Signed)
PT Cancellation Note  Patient Details Name: Lance Wiley MRN: [id number] DOB: [birth year]   Cancelled Treatment:    Reason Eval/Treat Not Completed: PT screened, no needs identified, will sign off.  Pt and wife both state that they have no PT needs. 10/05/2016  Americus Bing, PT [phone]  (pager)   Eliseo Gum Manette Doto 10/05/2016, 2:11 PM

## 2016-10-07 LAB — CULTURE, BLOOD (ROUTINE X 2)
CULTURE: NO GROWTH
CULTURE: NO GROWTH
SPECIAL REQUESTS: ADEQUATE
Special Requests: ADEQUATE

## 2018-08-05 IMAGING — MR MR FOOT*R* WO/W CM
4 of 9 series · 18 of 40 positions shown · IV contrast (Y M)
Comparison: Foot and ankle radiographs from 10/01/2016.

CLINICAL DATA: 40-year-old male with traumatic brain injury 7 years
ago presenting with discharge from the forefoot with the plantar
ulcer at the ball of the foot.

EXAM:
MRI OF THE RIGHT FOREFOOT WITHOUT AND WITH CONTRAST
TECHNIQUE: Multiplanar, multisequence MR imaging of the right forefoot was
performed before and after the administration of intravenous
contrast.
CONTRAST:  20mL MULTIHANCE GADOBENATE DIMEGLUMINE 529 MG/ML IV SOLN

[Series 7: T2 fat-sat · coronal · 5.0mm · 0.27mm/px · 6 of 33 slices shown (1 of 3)]
[im 1/33]
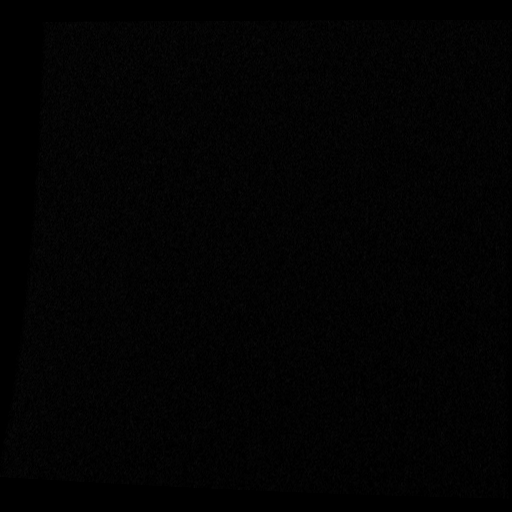
[im 7/33]
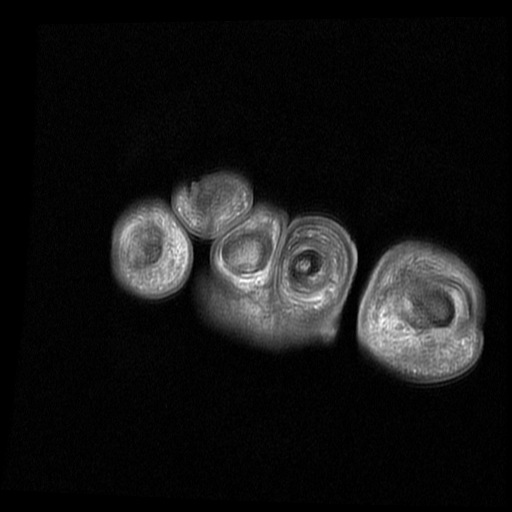
[im 13/33]
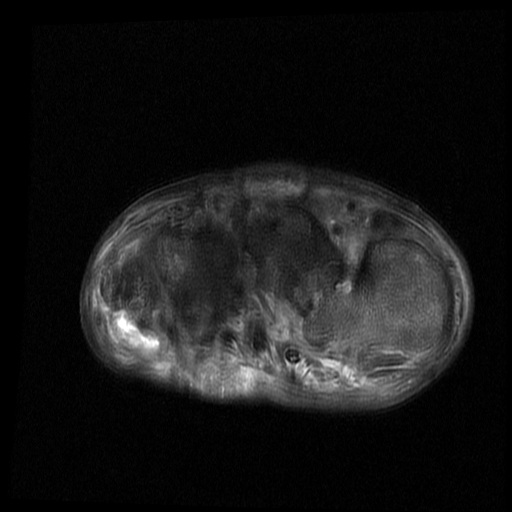
[im 20/33]
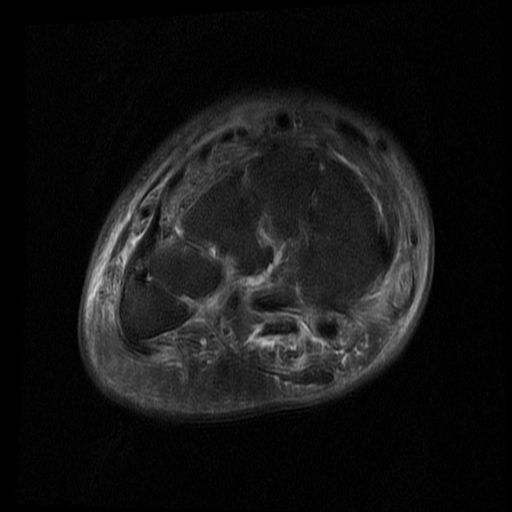
[im 26/33]
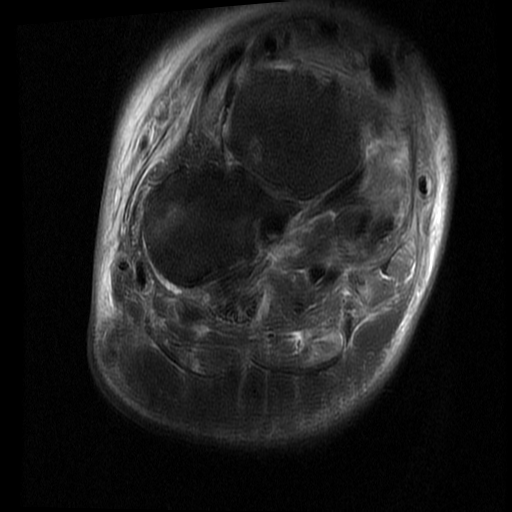
[im 33/33]
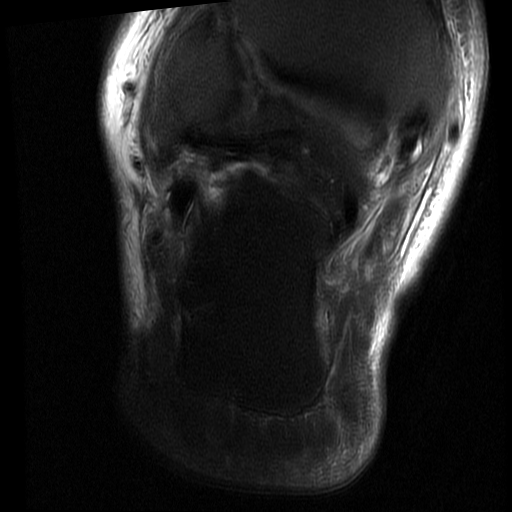

[Series 9: PD fat-sat · coronal · 5.0mm · 0.27mm/px · 6 of 33 slices shown]
[im 1/33]
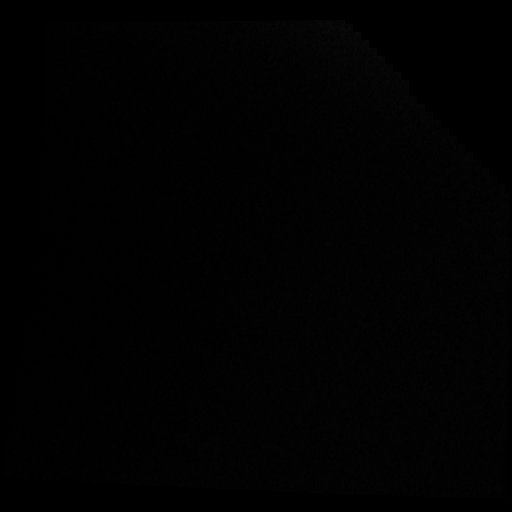
[im 7/33]
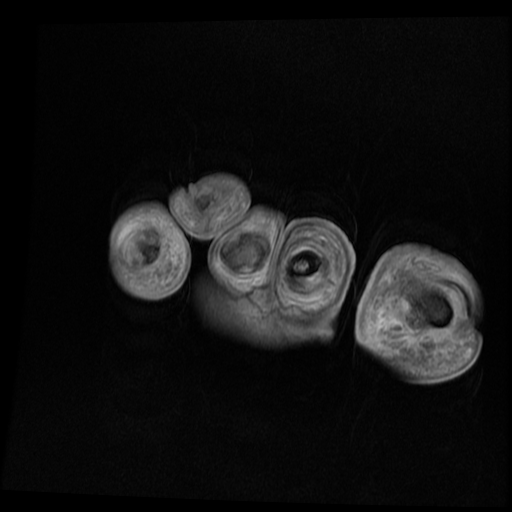
[im 13/33]
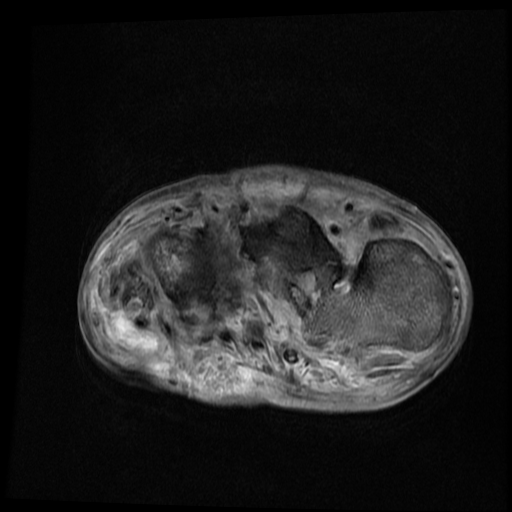
[im 20/33]
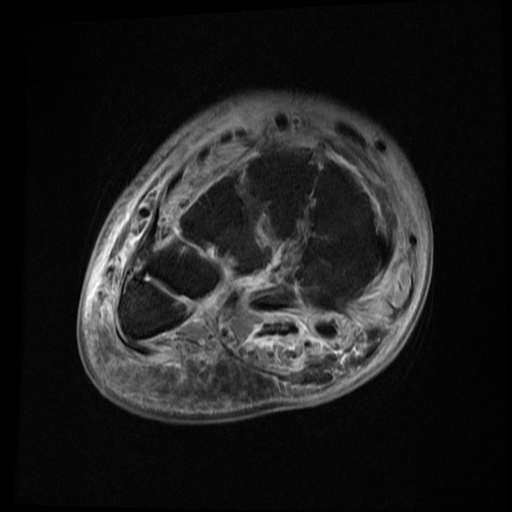
[im 26/33]
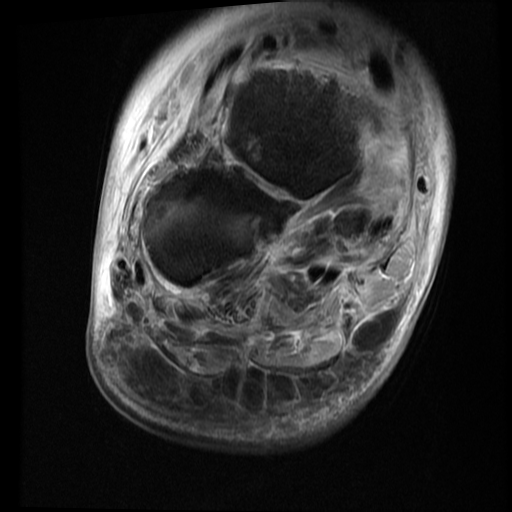
[im 33/33]
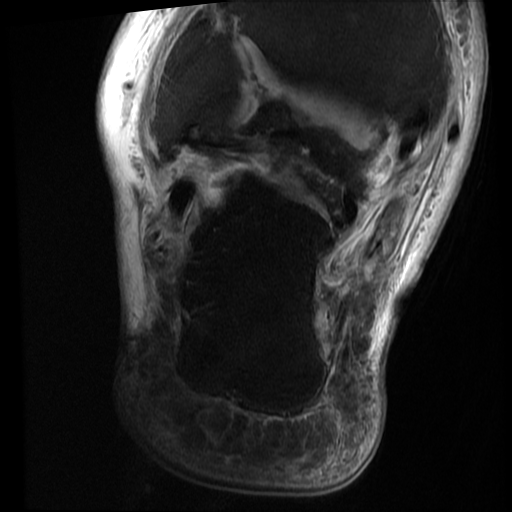

[Series 10: T2 fat-sat · oblique · 4.0mm · 0.55mm/px · 3 of 21 slices shown (2 of 3)]
[im 1/21]
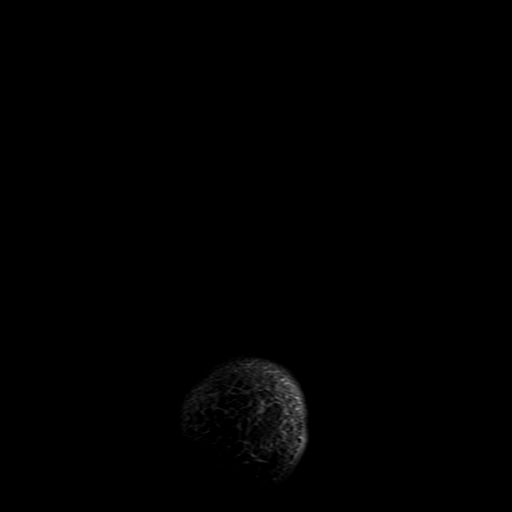
[im 11/21]
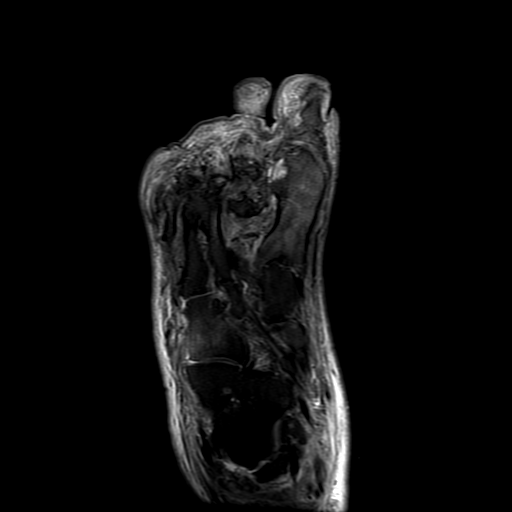
[im 21/21]
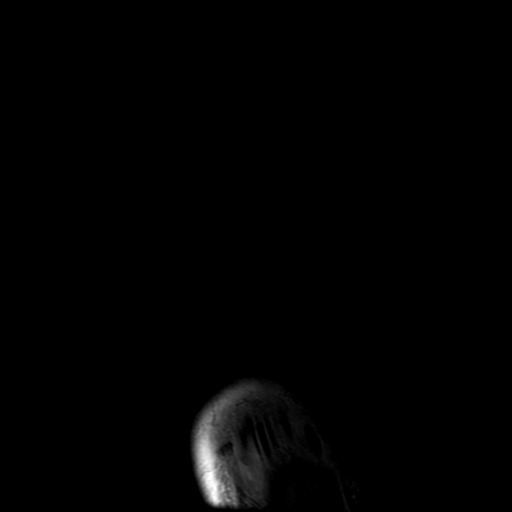

[Series 11: T2 fat-sat · sagittal · 4.0mm · 0.43mm/px · 3 of 21 slices shown (3 of 3)]
[im 1/21]
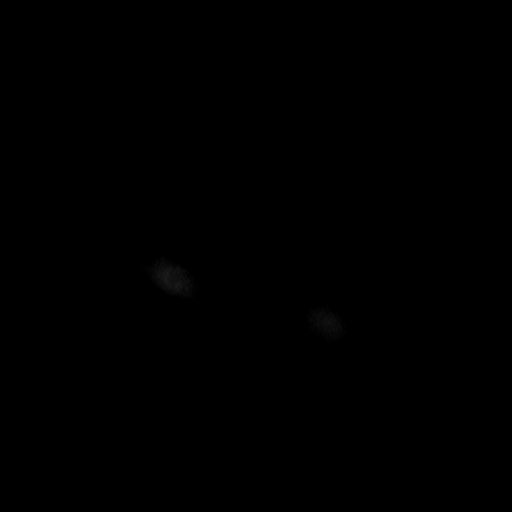
[im 11/21]
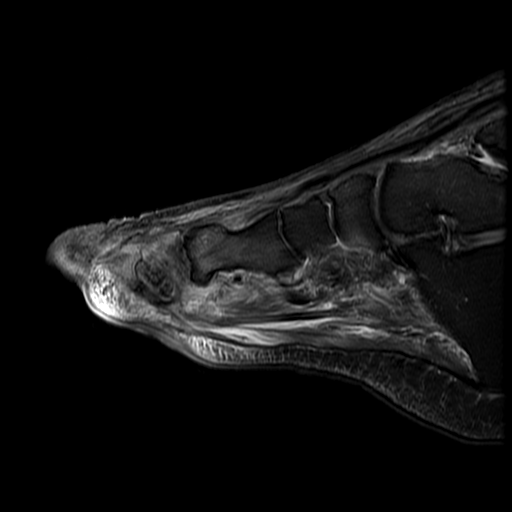
[im 21/21]
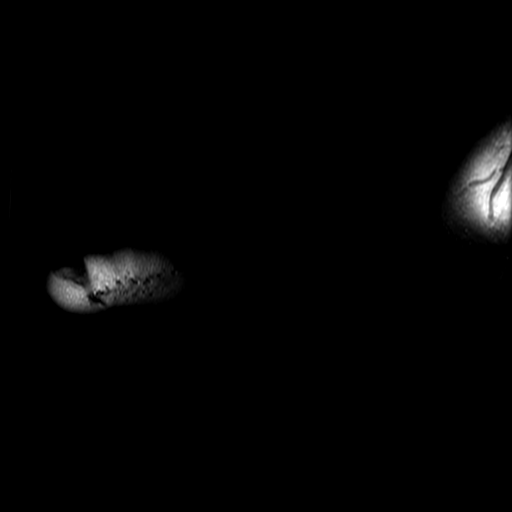

[18 of 40 positions shown; findings below may reference images not displayed]

FINDINGS: Bones/Joint/Cartilage

Chronic deformity of the first through fifth metatarsal heads likely
related to old remote trauma and/or changes of chronic
osteomyelitis. Tapered appearance of the fifth metatarsal head with
subluxed appearance of the fifth MTP joint is noted. Adjacent soft
tissue abscess is seen at the level of the fifth MTP as described
below without marrow signal abnormalities to indicate acute
osteomyelitis. Reactive marrow edema seen involving the second and
third proximal phalanges, talus, navicular and cuboid. Chronic
extra-articular erosion of the lateral first metatarsal.

Transchondral remote appearing defect along the lateral talar dome
with old posttraumatic deformity and ossifications off the lateral
and medial malleolus.

Ligaments

Negative

Muscles and Tendons

Pyomyositis at the base of the fifth toe. Edema of the extensor and
flexor muscles of the mid and forefoot consistent with diffuse
myositis.

Soft tissues

Diffuse soft tissue edema of the included mid and forefoot
consistent with cellulitis with small 16 x 7 x 11 mm soft tissue
abscess along the plantar aspect of the fifth ray at the level of
the MTP joint involving the adjacent flexor muscle consistent with
pyomyositis. Soft tissue ulceration is seen along the plantar aspect
of the forefoot in the first webspace.
IMPRESSION: 1. Diffuse myositis of the flexor and extensor muscles of the mid
and forefoot with small focus of pyomyositis involving the fifth
toe, plantar aspect measuring 16 x 7 x 11 mm.
2. Posttraumatic deformity of the metatarsals without evidence of
acute osteomyelitis.
3. Lateral talar dome defect likely related to old remote trauma.
4. Reactive marrow edema as above described involving the second and
third proximal phalanges, talus, navicular and cuboid with
osteoarthritis.
5. Soft tissue ulcer along the plantar aspect of the forefoot at the
level of the first webspace.
